# Patient Record
Sex: Female | Born: 1970 | Race: Black or African American | Hispanic: No | Marital: Single | Smoking: Never smoker
Health system: Southern US, Community
[De-identification: ages and names within clinical notes are randomized; demographics above are authoritative.]

## PROBLEM LIST (undated history)

## (undated) DIAGNOSIS — E119 Type 2 diabetes mellitus without complications: Secondary | ICD-10-CM

## (undated) DIAGNOSIS — I10 Essential (primary) hypertension: Secondary | ICD-10-CM

## (undated) DIAGNOSIS — C801 Malignant (primary) neoplasm, unspecified: Secondary | ICD-10-CM

## (undated) HISTORY — PX: HERNIA REPAIR: SHX51

## (undated) HISTORY — PX: ABDOMINAL HYSTERECTOMY: SHX81

---

## 2017-06-27 ENCOUNTER — Emergency Department (HOSPITAL_COMMUNITY)
Admission: EM | Admit: 2017-06-27 | Discharge: 2017-06-27 | Disposition: A | Discharge status: Home / Self Care | Payer: Medicare Other | Attending: Emergency Medicine | Admitting: Emergency Medicine

## 2017-06-27 ENCOUNTER — Encounter (HOSPITAL_COMMUNITY): Payer: Self-pay | Admitting: Neurology

## 2017-06-27 ENCOUNTER — Emergency Department (HOSPITAL_COMMUNITY): Payer: Medicare Other

## 2017-06-27 DIAGNOSIS — M25562 Pain in left knee: Secondary | ICD-10-CM | POA: Insufficient documentation

## 2017-06-27 DIAGNOSIS — I10 Essential (primary) hypertension: Secondary | ICD-10-CM | POA: Insufficient documentation

## 2017-06-27 DIAGNOSIS — Z8543 Personal history of malignant neoplasm of ovary: Secondary | ICD-10-CM | POA: Insufficient documentation

## 2017-06-27 DIAGNOSIS — E119 Type 2 diabetes mellitus without complications: Secondary | ICD-10-CM | POA: Diagnosis not present

## 2017-06-27 HISTORY — DX: Type 2 diabetes mellitus without complications: E11.9

## 2017-06-27 HISTORY — DX: Essential (primary) hypertension: I10

## 2017-06-27 HISTORY — DX: Malignant (primary) neoplasm, unspecified: C80.1

## 2017-06-27 MED ORDER — IBUPROFEN 400 MG PO TABS
600.0000 mg | ORAL_TABLET | Freq: Once | ORAL | Status: AC
  Administered 2017-06-27: 600 mg via ORAL
  Filled 2017-06-27: qty 1

## 2017-06-27 NOTE — Progress Notes (Signed)
Orthopedic Tech Progress Note Patient Details:  Sheena Bell 09-26-1970 [id number]  Ortho Devices Type of Ortho Device: Knee Immobilizer, Crutches Ortho Device/Splint Interventions: Application   Post Interventions Patient Tolerated: Well, Ambulated well Instructions Provided: Poper ambulation with device, Care of device, Adjustment of device   [name] 06/27/2017, 10:46 AM

## 2017-06-27 NOTE — ED Provider Notes (Signed)
She twisted her left knee 2 days ago when she jumped up suddenly.  She complains of left knee pain nonradiating no other injury.  No direct trauma.  On exam she is alert no distress left lower extremity skin intact.  No swelling at knee.  No ligamentous laxity.  No point tenderness.  Negative Lachman sign negative posterior drawer sign.  DP pulse 2+.  She walks with slight limp favoring left leg.  X-ray viewed by me   Orlie Dakin, MD 06/27/17 1007

## 2017-06-27 NOTE — ED Notes (Signed)
Pt states she understands instructions and is comfortable with instructions for crutch use.

## 2017-06-27 NOTE — ED Provider Notes (Signed)
[facility] EMERGENCY DEPARTMENT Provider Note   CSN: [id number] Arrival date & time: 06/27/17  0745     History   Chief Complaint Chief Complaint  Patient presents with  . Knee Pain    HPI Sheena Bell is a 46 y.o. female with a history of HTN, Ovarian cancer, DM who presents today for evaluation of acute onset left knee pain.  She reports that her family member is in the hospital and she is visiting from [place].  She stood up quickly when her family members trach became dislodged and after her adrenaline went away she noticed pain in her left knee. Her pain is worse on the sides of her knees.  She reports that it is made better by keeping it still.  She has tried ice without relief.    HPI  Past Medical History:  Diagnosis Date  . Cancer ([place])   . Diabetes mellitus without complication ([place])   . Hypertension     There are no active problems to display for this patient.   Past Surgical History:  Procedure Laterality Date  . ABDOMINAL HYSTERECTOMY    . HERNIA REPAIR      OB History    No data available       Home Medications    Prior to Admission medications   Not on File    Family History No family history on file.  Social History Social History   Tobacco Use  . Smoking status: Never Smoker  . Smokeless tobacco: Never Used  Substance Use Topics  . Alcohol use: No    Frequency: Never  . Drug use: Not on file     Allergies   Ultram [tramadol hcl]; Flagyl [metronidazole]; and Morphine and related   Review of Systems Review of Systems  Constitutional: Negative for chills and fever.  Musculoskeletal: Positive for arthralgias. Negative for back pain, gait problem and joint swelling.  Neurological: Negative for weakness and numbness.     Physical Exam Updated Vital Signs BP (!) 141/97   Pulse 76   Temp 98 F (36.7 C) (Oral)   Resp 16   Ht 5' 4.5" (1.638 m)   Wt 97.5 kg (215 lb)   SpO2 95%   BMI 36.33 kg/m    Physical Exam  Constitutional: She appears well-developed and well-nourished. No distress.  HENT:  Head: Normocephalic and atraumatic.  Cardiovascular:  2+ DP/PT pulses on left leg.  Pulmonary/Chest: Effort normal.  Musculoskeletal:  Left knee is tender to palpation over medial and lateral aspects, slightly worse on medial aspect.  There is no obvious crepitus with active range of motion.  Flexion is limited secondary to pain.  Knee is grossly stable on exam to anterior/posterior drawer test and valgus/varus stress however valgus/varus stress produce significant pain.  Neurological:  Sensation intact to left lower extremity.  Skin: She is not diaphoretic.  No obvious ecchymosis or wounds around left knee.  Psychiatric: She has a normal mood and affect. Her behavior is normal.  Nursing note and vitals reviewed.    ED Treatments / Results  Labs (all labs ordered are listed, but only abnormal results are displayed) Labs Reviewed - No data to display  EKG  EKG Interpretation None       Radiology Dg Knee Complete 4 Views Left  Result Date: 06/27/2017 CLINICAL DATA:  Left knee injury.  Initial encounter. EXAM: LEFT KNEE - COMPLETE 4+ VIEW COMPARISON:  None. FINDINGS: No fracture, dislocation, or definite joint effusion. Incidental insertional  changes of the tibial tuberosity and posterior upper tibia. IMPRESSION: Negative. Electronically Signed   By: Bell Fantasia M.D.   On: 06/27/2017 08:20    Procedures Procedures (including critical care time)  Medications Ordered in ED Medications  ibuprofen (ADVIL,MOTRIN) tablet 600 mg (600 mg Oral Given 06/27/17 0946)     Initial Impression / Assessment and Plan / ED Course  I have reviewed the triage vital signs and the nursing notes.  Pertinent labs & imaging results that were available during my care of the patient were reviewed by me and considered in my medical decision making (see chart for details).    Pt with mild  swelling to the joint spaces, knee swelling, tightness in the knee, and mildly restricted range of motion. Pt unable to perform full flexion of the knee.  Pt is without systemic symptoms, erythema or redness of the joint consistent with gout or septic joint.  Patient X-Ray negative for obvious fracture or dislocation. Pain managed in ED. Pt advised to follow up with orthopedics if symptoms persist for further evaluation and treatment. Patient given brace while in ED, conservative therapy recommended and discussed. Patient will be dc home & is agreeable with above plan.  Patient was seen as a shared visit with Dr. Winfred Leeds who evaluated the patient and agreed with my plan.   Final Clinical Impressions(s) / ED Diagnoses   Final diagnoses:  Acute pain of left knee    ED Discharge Orders    None       Ollen Gross 06/27/17 1017    Orlie Dakin, MD 06/27/17 1526

## 2017-06-27 NOTE — Discharge Instructions (Signed)
While in the ED your blood pressure was high.  Please follow up with your primary care doctor or the wellness clinic for repeat evaluation as you may need medication.  High blood pressure can cause long term, potentially serious, damage if left untreated.   Please take Ibuprofen (Advil, motrin) and Tylenol (acetaminophen) to relieve your pain.  You may take up to 600 MG (3 pills) of normal strength ibuprofen every 8 hours as needed.  In between doses of ibuprofen you make take tylenol, up to 1,000 mg (two extra strength pills).  Do not take more than 3,000 mg tylenol in a 24 hour period.  Please check all medication labels as many medications such as pain and cold medications may contain tylenol.  Do not drink alcohol while taking these medications.  Do not take other NSAID'S while taking ibuprofen (such as aleve or naproxen).  Please take ibuprofen with food to decrease stomach upset.

## 2017-06-27 NOTE — ED Notes (Signed)
Called pt for triage no answer 

## 2017-06-27 NOTE — ED Notes (Signed)
Ortho tech paged  

## 2017-06-27 NOTE — ED Notes (Signed)
Ortho tech contacted.

## 2017-06-27 NOTE — ED Triage Notes (Signed)
Pt reports is here with her family member on the 5th floor, was assisting when her family member's trach popped out. The sudden movement caused her left knee to start hurting. Has been using ice, but no relief, with swelling.

## 2019-02-28 IMAGING — CR DG KNEE COMPLETE 4+V*L*
4 series · 4 of 4 positions shown · non-contrast
Comparison: None.

CLINICAL DATA: Left knee injury.  Initial encounter.

EXAM:
LEFT KNEE - COMPLETE 4+ VIEW

[knee ap]
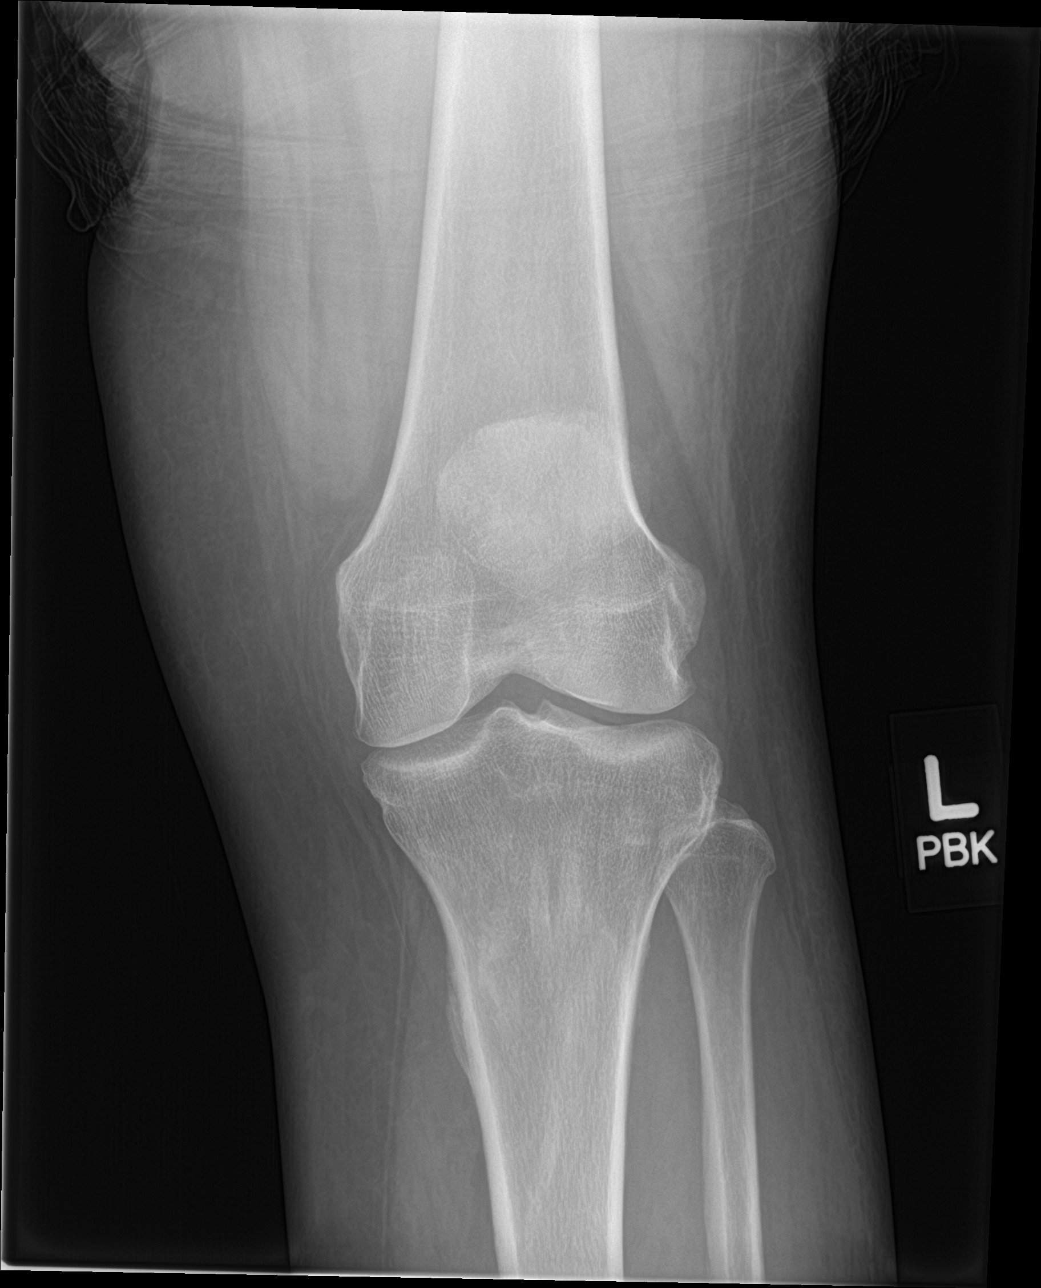

[knee lat]
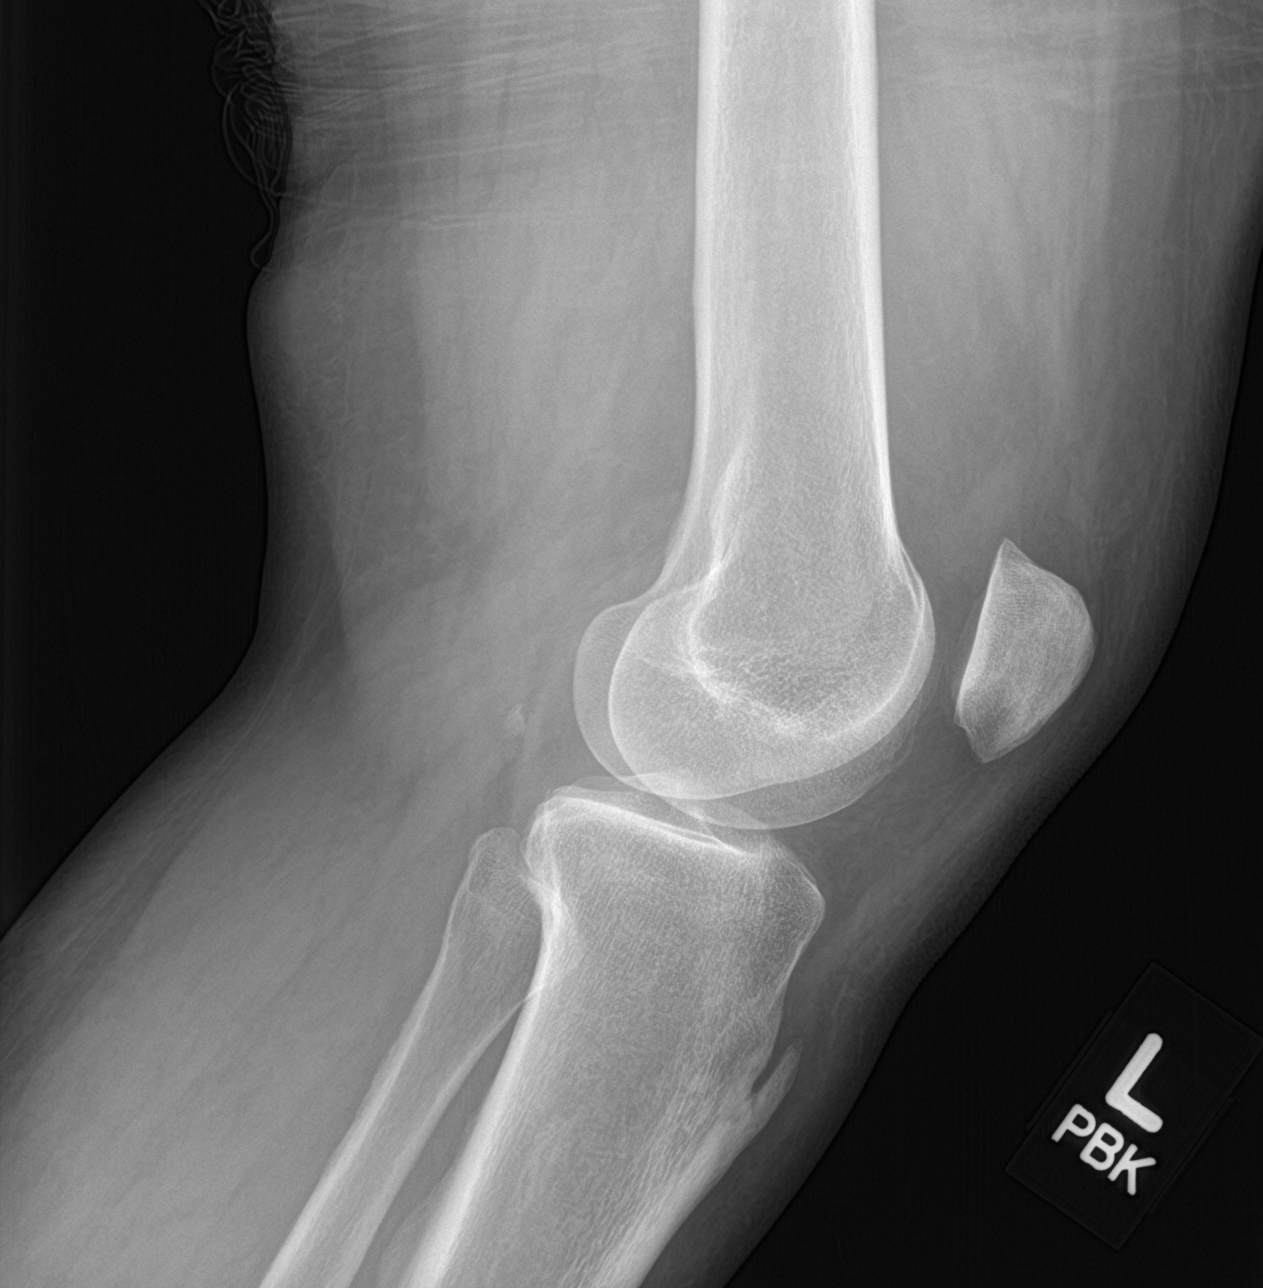

[knee obl (1 of 2)]
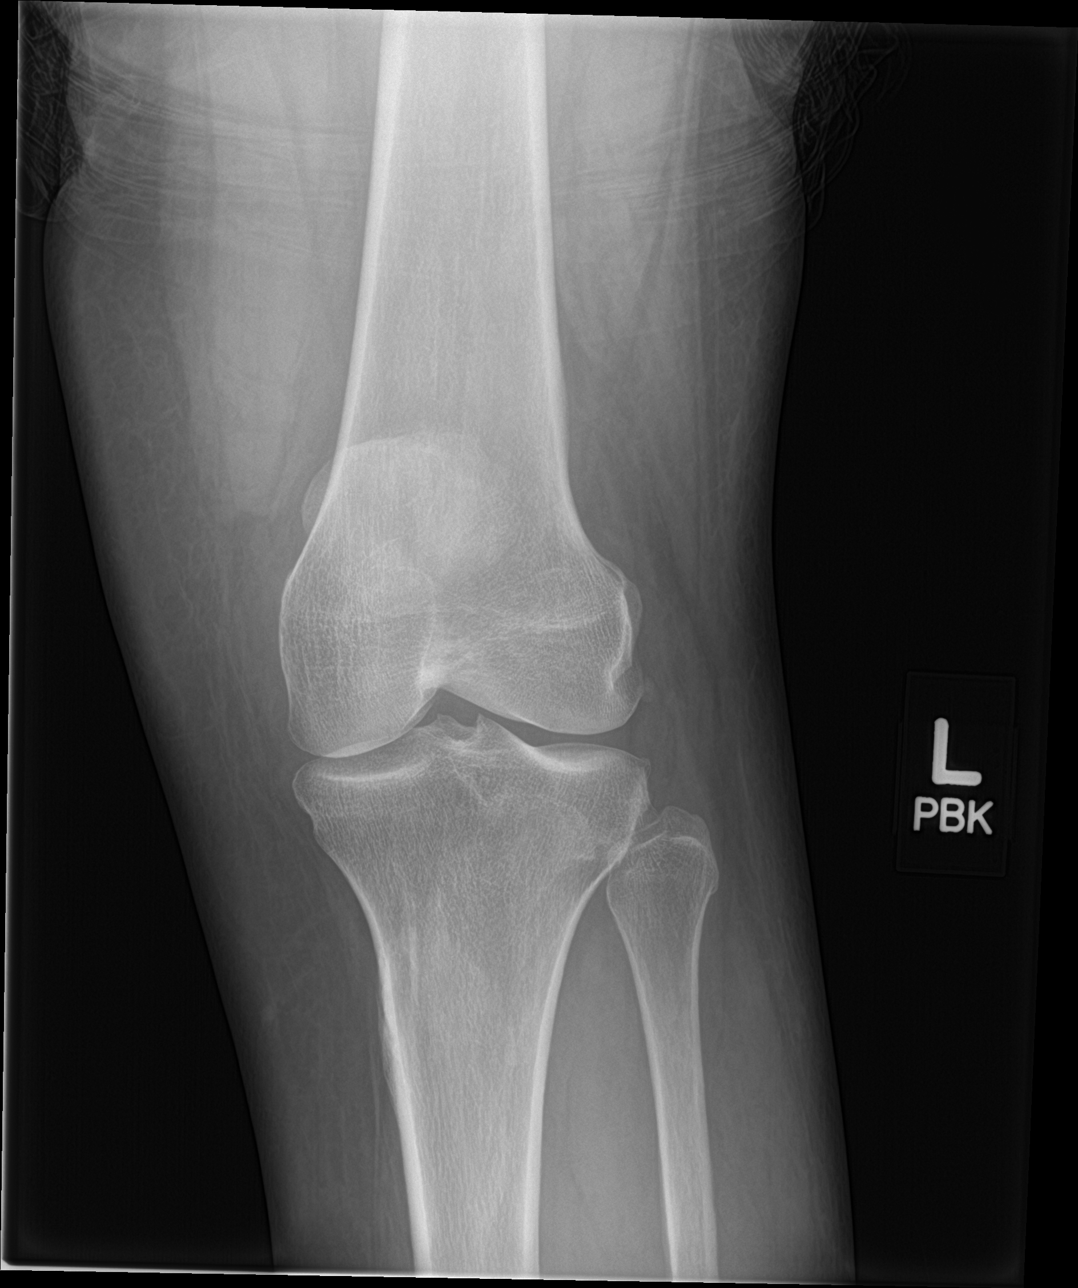

[knee obl (2 of 2)]
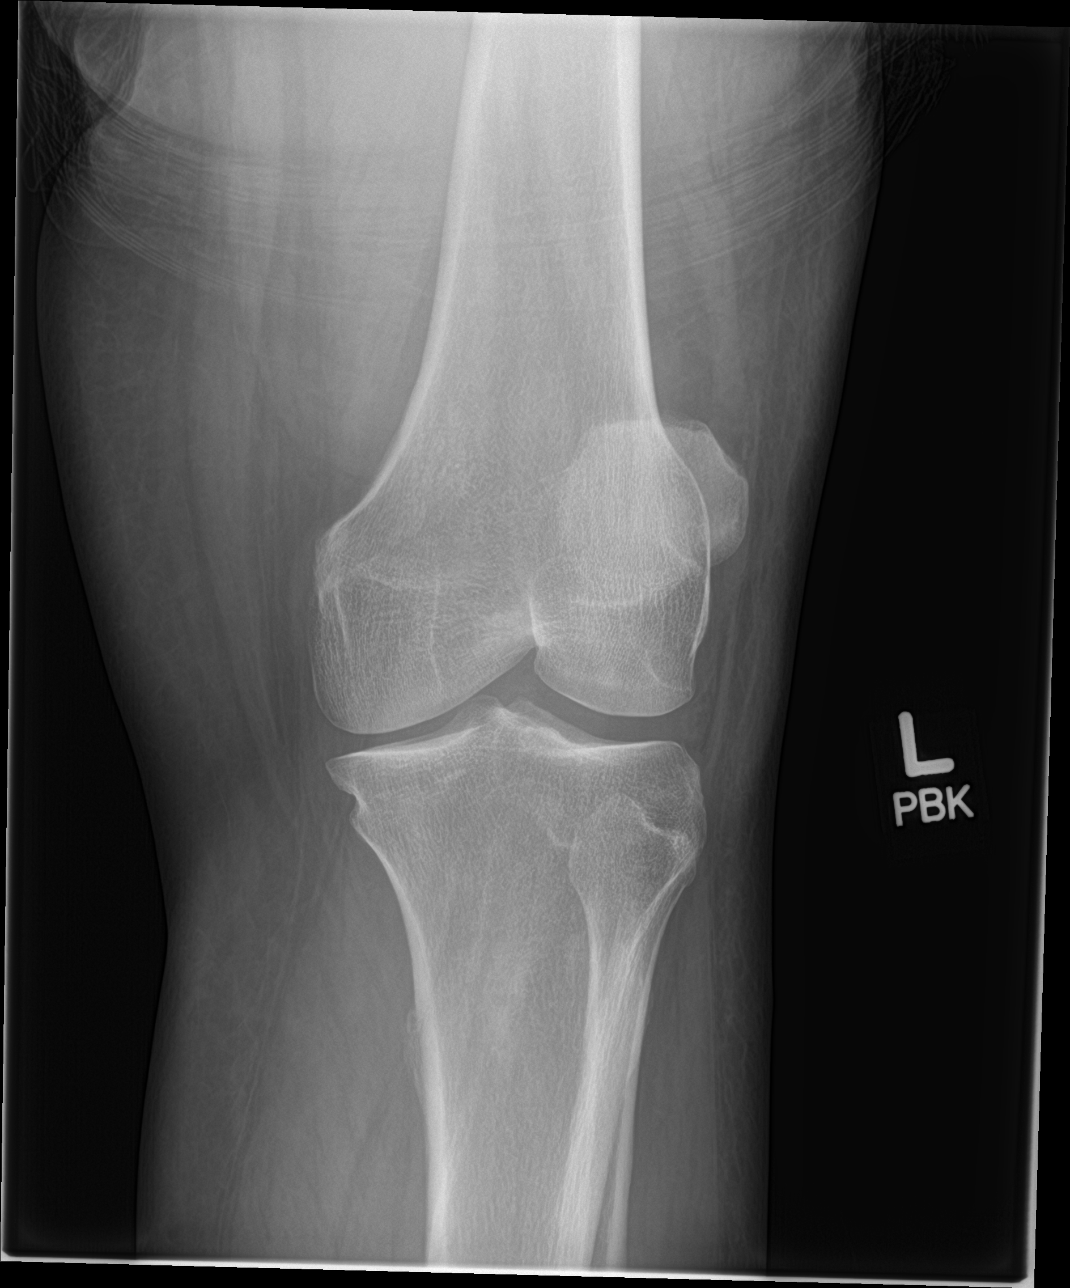

[4 of 4 positions shown; findings below may reference images not displayed]

FINDINGS: No fracture, dislocation, or definite joint effusion. Incidental
insertional changes of the tibial tuberosity and posterior upper
tibia.
IMPRESSION: Negative.
